# Patient Record
Sex: Female | Born: 1975 | Race: White | Hispanic: No | Marital: Married | Smoking: Never smoker
Health system: Southern US, Community
[De-identification: ages and names within clinical notes are randomized; demographics above are authoritative.]

## PROBLEM LIST (undated history)

## (undated) DIAGNOSIS — R51 Headache: Secondary | ICD-10-CM

## (undated) DIAGNOSIS — R519 Headache, unspecified: Secondary | ICD-10-CM

## (undated) DIAGNOSIS — O24419 Gestational diabetes mellitus in pregnancy, unspecified control: Secondary | ICD-10-CM

## (undated) DIAGNOSIS — E119 Type 2 diabetes mellitus without complications: Secondary | ICD-10-CM

## (undated) HISTORY — DX: Headache: R51

## (undated) HISTORY — DX: Headache, unspecified: R51.9

## (undated) HISTORY — PX: NO PAST SURGERIES: SHX2092

## (undated) HISTORY — DX: Type 2 diabetes mellitus without complications: E11.9

## (undated) HISTORY — DX: Gestational diabetes mellitus in pregnancy, unspecified control: O24.419

---

## 2015-09-06 NOTE — L&D Delivery Note (Signed)
Delivery Note At 4:47 PM a viable female was delivered via Vaginal, Spontaneous Delivery (presentation: vertex  APGAR: 8, 9; weight pending   Placenta status: to pathology as likely velamentous insertion, Cord:  with the following complications: none  Anesthesia:   Episiotomy: None Lacerations: 2nd degree;Perineal Suture Repair: 2.0 3.0 vicryl rapide Est. Blood Loss (mL):  200cc  Mom to postpartum.  Baby to Couplet care / Skin to Skin.  It's a girl, "Alison Roth!" AD2GM - for fasting BS in AM  Alison Roth 05/12/2016, 5:26 PM

## 2015-10-13 LAB — OB RESULTS CONSOLE HEPATITIS B SURFACE ANTIGEN: Hepatitis B Surface Ag: NEGATIVE

## 2015-10-13 LAB — OB RESULTS CONSOLE GC/CHLAMYDIA
Chlamydia: NEGATIVE
Gonorrhea: NEGATIVE

## 2015-10-13 LAB — OB RESULTS CONSOLE ANTIBODY SCREEN: Antibody Screen: NEGATIVE

## 2015-10-13 LAB — OB RESULTS CONSOLE RPR: RPR: NONREACTIVE

## 2015-10-13 LAB — OB RESULTS CONSOLE ABO/RH: RH TYPE: POSITIVE

## 2015-10-13 LAB — OB RESULTS CONSOLE HIV ANTIBODY (ROUTINE TESTING): HIV: NONREACTIVE

## 2015-10-13 LAB — OB RESULTS CONSOLE RUBELLA ANTIBODY, IGM: RUBELLA: IMMUNE

## 2016-03-14 ENCOUNTER — Encounter: Payer: Self-pay | Admitting: Skilled Nursing Facility1

## 2016-03-14 ENCOUNTER — Encounter: Payer: BC Managed Care – PPO | Attending: Obstetrics and Gynecology | Admitting: Skilled Nursing Facility1

## 2016-03-14 VITALS — Ht 67.0 in | Wt 239.0 lb

## 2016-03-14 DIAGNOSIS — Z713 Dietary counseling and surveillance: Secondary | ICD-10-CM | POA: Insufficient documentation

## 2016-03-14 DIAGNOSIS — O99810 Abnormal glucose complicating pregnancy: Secondary | ICD-10-CM | POA: Insufficient documentation

## 2016-03-14 DIAGNOSIS — O24410 Gestational diabetes mellitus in pregnancy, diet controlled: Secondary | ICD-10-CM

## 2016-03-14 NOTE — Progress Notes (Signed)
  Patient was seen on 03/14/2016 for Gestational Diabetes self-management class at the [facility]. The following learning objectives were met by the patient during this course:   States the definition of Gestational Diabetes  States why dietary management is important in controlling blood glucose  Describes the effects each nutrient has on blood glucose levels  Demonstrates ability to create a balanced meal plan  Demonstrates carbohydrate counting   States when to check blood glucose levels involving a total of 4 separate occurences in a day  Demonstrates proper blood glucose monitoring techniques  States the effect of stress and exercise on blood glucose levels  States the importance of limiting caffeine and abstaining from alcohol and smoking  Demonstrates the knowledge the glucometer provided in class may not be covered by their insurance and to call their insurance provider immediately after class to know which glucometer their insurance provider does cover as well as calling their physician the next day for a prescription to the glucometer their insurance does cover (if the one provided is not) as well as the lancets and strips for that meter.  Pt answered a phone call during class and was asked to step out while she talked on the cell phone  Blood glucose monitor given: pt states she already has one and is currently testing   Patient instructed to monitor glucose levels: FBS: 60 - <90 1 hour: <140 2 hour: <120  *Patient received handouts:  Nutrition Diabetes and Pregnancy  Carbohydrate Counting List  Patient will be seen for follow-up as needed.

## 2016-05-05 ENCOUNTER — Telehealth (HOSPITAL_COMMUNITY): Payer: Self-pay | Admitting: *Deleted

## 2016-05-05 ENCOUNTER — Encounter (HOSPITAL_COMMUNITY): Payer: Self-pay | Admitting: *Deleted

## 2016-05-05 LAB — OB RESULTS CONSOLE GBS: STREP GROUP B AG: POSITIVE

## 2016-05-05 NOTE — Telephone Encounter (Signed)
Preadmission screen  

## 2016-05-11 NOTE — H&P (Signed)
Alison MeagerKristy Roth is a 40 y.o. female presenting for IOL for h/o A2GDM. She has a favorable cervix (bishop's score 9). Her GDM is well c/w glyburide 2.5mg  qd. She is GBS positive and PCN allergic.   OB History    Gravida Para Term Preterm AB Living   2       1     SAB TAB Ectopic Multiple Live Births   1             Past Medical History:  Diagnosis Date  . Diabetes mellitus without complication (HCC)   . Gestational diabetes    glyburide  . Headache    Past Surgical History:  Procedure Laterality Date  . NO PAST SURGERIES     Family History: family history is not on file. She was adopted. Social History:  reports that she has never smoked. She has never used smokeless tobacco. Her alcohol and drug histories are not on file.     Maternal Diabetes: Yes:  Diabetes Type:  Insulin/Medication controlled Genetic Screening: Normal Maternal Ultrasounds/Referrals: Normal Fetal Ultrasounds or other Referrals:  None Maternal Substance Abuse:  No Significant Maternal Medications:  None except GBS pos Significant Maternal Lab Results:  None Other Comments:  None  ROS WNL except HPI History   Last menstrual period 08/13/2015. Exam Physical Exam  (from clinic) NAD, A&O NWOB Abd soft, nondistended, gravid SVE 3/70/-2, vertex  Prenatal labs: ABO, Rh: O/Positive/-- (02/07 0000) Antibody: Negative (02/07 0000) Rubella: Immune (02/07 0000) RPR: Nonreactive (02/07 0000)  HBsAg: Negative (02/07 0000)  HIV: Non-reactive (02/07 0000)  GBS: Positive (08/31 0000)   Assessment/Plan: A/P: Pt is a G2P0010 @ 39.0wga here for IOL for AGDM w/favorable cervix  IOL: A2GDM Bishop's score 9, SVE in office 3/70/-2 Plan for pitocin/AROM  MWB: h/o A2GDM c/w glyburide 2.5mg  qd Plan for intrapartum BS monitoring per protocol  FWB: vertex, last US 77%-ile Baby girl with normal panorama screen and US  GBS pos, clindamycin per protocol as PCN allergy  Belva AgeeElise Daphna Lafuente MD    [name]  Alison Roth 05/11/2016, 9:54 AM

## 2016-05-12 ENCOUNTER — Encounter (HOSPITAL_COMMUNITY): Payer: Self-pay

## 2016-05-12 ENCOUNTER — Inpatient Hospital Stay (HOSPITAL_COMMUNITY): Payer: BC Managed Care – PPO | Admitting: Anesthesiology

## 2016-05-12 ENCOUNTER — Inpatient Hospital Stay (HOSPITAL_COMMUNITY)
Admission: RE | Admit: 2016-05-12 | Discharge: 2016-05-14 | DRG: 775 | Disposition: A | Discharge status: Home / Self Care | Payer: BC Managed Care – PPO | Source: Ambulatory Visit | Attending: Obstetrics and Gynecology | Admitting: Obstetrics and Gynecology

## 2016-05-12 DIAGNOSIS — Z3A39 39 weeks gestation of pregnancy: Secondary | ICD-10-CM

## 2016-05-12 DIAGNOSIS — Z880 Allergy status to penicillin: Secondary | ICD-10-CM

## 2016-05-12 DIAGNOSIS — O24425 Gestational diabetes mellitus in childbirth, controlled by oral hypoglycemic drugs: Principal | ICD-10-CM | POA: Diagnosis present

## 2016-05-12 DIAGNOSIS — O24419 Gestational diabetes mellitus in pregnancy, unspecified control: Secondary | ICD-10-CM | POA: Diagnosis present

## 2016-05-12 DIAGNOSIS — O43123 Velamentous insertion of umbilical cord, third trimester: Secondary | ICD-10-CM | POA: Diagnosis present

## 2016-05-12 DIAGNOSIS — O99824 Streptococcus B carrier state complicating childbirth: Secondary | ICD-10-CM | POA: Diagnosis present

## 2016-05-12 LAB — RPR: RPR Ser Ql: NONREACTIVE

## 2016-05-12 LAB — CBC
HEMATOCRIT: 39.1 % (ref 36.0–46.0)
HEMOGLOBIN: 13.6 g/dL (ref 12.0–15.0)
MCH: 30.4 pg (ref 26.0–34.0)
MCHC: 34.8 g/dL (ref 30.0–36.0)
MCV: 87.5 fL (ref 78.0–100.0)
Platelets: 242 10*3/uL (ref 150–400)
RBC: 4.47 MIL/uL (ref 3.87–5.11)
RDW: 13.8 % (ref 11.5–15.5)
WBC: 8.7 10*3/uL (ref 4.0–10.5)

## 2016-05-12 LAB — ABO/RH: ABO/RH(D): O POS

## 2016-05-12 LAB — GLUCOSE, CAPILLARY
Glucose-Capillary: 70 mg/dL (ref 65–99)
Glucose-Capillary: 78 mg/dL (ref 65–99)

## 2016-05-12 LAB — TYPE AND SCREEN
ABO/RH(D): O POS
ANTIBODY SCREEN: NEGATIVE

## 2016-05-12 LAB — GLUCOSE, RANDOM: GLUCOSE: 98 mg/dL (ref 65–99)

## 2016-05-12 MED ORDER — OXYCODONE-ACETAMINOPHEN 5-325 MG PO TABS: 2.0000 | ORAL_TABLET | ORAL | Status: DC | PRN

## 2016-05-12 MED ORDER — TETANUS-DIPHTH-ACELL PERTUSSIS 5-2.5-18.5 LF-MCG/0.5 IM SUSP: 0.5000 mL | Freq: Once | INTRAMUSCULAR | Status: DC

## 2016-05-12 MED ORDER — IBUPROFEN 600 MG PO TABS
600.0000 mg | ORAL_TABLET | Freq: Four times a day (QID) | ORAL | Status: DC
  Administered 2016-05-12 – 2016-05-14 (×6): 600 mg via ORAL
  Filled 2016-05-12 (×6): qty 1

## 2016-05-12 MED ORDER — ONDANSETRON HCL 4 MG/2ML IJ SOLN
4.0000 mg | Freq: Four times a day (QID) | INTRAMUSCULAR | Status: DC | PRN
Start: 2016-05-12 — End: 2016-05-12

## 2016-05-12 MED ORDER — ACETAMINOPHEN 325 MG PO TABS: 650.0000 mg | ORAL_TABLET | ORAL | Status: DC | PRN

## 2016-05-12 MED ORDER — OXYCODONE-ACETAMINOPHEN 5-325 MG PO TABS: 1.0000 | ORAL_TABLET | ORAL | Status: DC | PRN

## 2016-05-12 MED ORDER — LIDOCAINE HCL (PF) 1 % IJ SOLN
30.0000 mL | INTRAMUSCULAR | Status: DC | PRN
Start: 2016-05-12 — End: 2016-05-12
  Filled 2016-05-12: qty 30

## 2016-05-12 MED ORDER — PRENATAL MULTIVITAMIN CH
1.0000 | ORAL_TABLET | Freq: Every day | ORAL | Status: DC
  Administered 2016-05-13: 1 via ORAL
  Filled 2016-05-12: qty 1

## 2016-05-12 MED ORDER — TERBUTALINE SULFATE 1 MG/ML IJ SOLN
0.2500 mg | Freq: Once | INTRAMUSCULAR | Status: DC | PRN
  Filled 2016-05-12: qty 1

## 2016-05-12 MED ORDER — DIPHENHYDRAMINE HCL 25 MG PO CAPS: 25.0000 mg | ORAL_CAPSULE | Freq: Four times a day (QID) | ORAL | Status: DC | PRN

## 2016-05-12 MED ORDER — PHENYLEPHRINE 40 MCG/ML (10ML) SYRINGE FOR IV PUSH (FOR BLOOD PRESSURE SUPPORT)
80.0000 ug | PREFILLED_SYRINGE | INTRAVENOUS | Status: DC | PRN
  Filled 2016-05-12: qty 5

## 2016-05-12 MED ORDER — BENZOCAINE-MENTHOL 20-0.5 % EX AERO
1.0000 "application " | INHALATION_SPRAY | CUTANEOUS | Status: DC | PRN
  Administered 2016-05-13: 1 via TOPICAL
  Filled 2016-05-12 (×2): qty 56

## 2016-05-12 MED ORDER — OXYTOCIN 40 UNITS IN LACTATED RINGERS INFUSION - SIMPLE MED
1.0000 m[IU]/min | INTRAVENOUS | Status: DC
  Administered 2016-05-12: 2 m[IU]/min via INTRAVENOUS
  Filled 2016-05-12: qty 1000

## 2016-05-12 MED ORDER — CLINDAMYCIN PHOSPHATE 900 MG/50ML IV SOLN
900.0000 mg | Freq: Three times a day (TID) | INTRAVENOUS | Status: DC
  Administered 2016-05-12: 900 mg via INTRAVENOUS
  Filled 2016-05-12 (×3): qty 50

## 2016-05-12 MED ORDER — ONDANSETRON HCL 4 MG PO TABS: 4.0000 mg | ORAL_TABLET | ORAL | Status: DC | PRN

## 2016-05-12 MED ORDER — LACTATED RINGERS IV SOLN
500.0000 mL | Freq: Once | INTRAVENOUS | Status: DC
Start: 2016-05-12 — End: 2016-05-12

## 2016-05-12 MED ORDER — FENTANYL 2.5 MCG/ML BUPIVACAINE 1/10 % EPIDURAL INFUSION (WH - ANES)
14.0000 mL/h | INTRAMUSCULAR | Status: DC | PRN
  Administered 2016-05-12: 14 mL/h via EPIDURAL
  Filled 2016-05-12: qty 125

## 2016-05-12 MED ORDER — EPHEDRINE 5 MG/ML INJ
10.0000 mg | INTRAVENOUS | Status: DC | PRN
  Filled 2016-05-12: qty 4

## 2016-05-12 MED ORDER — PHENYLEPHRINE 40 MCG/ML (10ML) SYRINGE FOR IV PUSH (FOR BLOOD PRESSURE SUPPORT)
80.0000 ug | PREFILLED_SYRINGE | INTRAVENOUS | Status: DC | PRN
  Filled 2016-05-12: qty 5
  Filled 2016-05-12: qty 10

## 2016-05-12 MED ORDER — DIPHENHYDRAMINE HCL 50 MG/ML IJ SOLN: 12.5000 mg | INTRAMUSCULAR | Status: DC | PRN

## 2016-05-12 MED ORDER — SENNOSIDES-DOCUSATE SODIUM 8.6-50 MG PO TABS
2.0000 | ORAL_TABLET | ORAL | Status: DC
  Administered 2016-05-13: 2 via ORAL
  Filled 2016-05-12: qty 2

## 2016-05-12 MED ORDER — COCONUT OIL OIL
1.0000 "application " | TOPICAL_OIL | Status: DC | PRN
  Filled 2016-05-12: qty 120

## 2016-05-12 MED ORDER — DEXTROSE IN LACTATED RINGERS 5 % IV SOLN: INTRAVENOUS | Status: DC

## 2016-05-12 MED ORDER — LACTATED RINGERS IV SOLN: 500.0000 mL | INTRAVENOUS | Status: DC | PRN

## 2016-05-12 MED ORDER — OXYTOCIN BOLUS FROM INFUSION: 500.0000 mL | Freq: Once | INTRAVENOUS | Status: DC

## 2016-05-12 MED ORDER — LACTATED RINGERS IV SOLN
INTRAVENOUS | Status: DC
  Administered 2016-05-12 (×2): via INTRAVENOUS

## 2016-05-12 MED ORDER — ZOLPIDEM TARTRATE 5 MG PO TABS: 5.0000 mg | ORAL_TABLET | Freq: Every evening | ORAL | Status: DC | PRN

## 2016-05-12 MED ORDER — SIMETHICONE 80 MG PO CHEW: 80.0000 mg | CHEWABLE_TABLET | ORAL | Status: DC | PRN

## 2016-05-12 MED ORDER — DIBUCAINE 1 % RE OINT: 1.0000 "application " | TOPICAL_OINTMENT | RECTAL | Status: DC | PRN

## 2016-05-12 MED ORDER — ONDANSETRON HCL 4 MG/2ML IJ SOLN: 4.0000 mg | INTRAMUSCULAR | Status: DC | PRN

## 2016-05-12 MED ORDER — OXYTOCIN 40 UNITS IN LACTATED RINGERS INFUSION - SIMPLE MED: 2.5000 [IU]/h | INTRAVENOUS | Status: DC

## 2016-05-12 MED ORDER — FLEET ENEMA 7-19 GM/118ML RE ENEM: 1.0000 | ENEMA | RECTAL | Status: DC | PRN

## 2016-05-12 MED ORDER — LIDOCAINE HCL (PF) 1 % IJ SOLN
INTRAMUSCULAR | Status: DC | PRN
  Administered 2016-05-12 (×2): 7 mL via EPIDURAL

## 2016-05-12 MED ORDER — SOD CITRATE-CITRIC ACID 500-334 MG/5ML PO SOLN: 30.0000 mL | ORAL | Status: DC | PRN

## 2016-05-12 MED ORDER — WITCH HAZEL-GLYCERIN EX PADS: 1.0000 "application " | MEDICATED_PAD | CUTANEOUS | Status: DC | PRN

## 2016-05-12 NOTE — Anesthesia Pain Management Evaluation Note (Signed)
  CRNA Pain Management Visit Note  Patient: Alison Roth, 40 y.o., female  "Hello I am a member of the anesthesia team at [facility]. We have an anesthesia team available at all times to provide care throughout the hospital, including epidural management and anesthesia for C-section. I don't know your plan for the delivery whether it a natural birth, water birth, IV sedation, nitrous supplementation, doula or epidural, but we want to meet your pain goals."   1.Was your pain managed to your expectations on prior hospitalizations?   No prior hospitalizations  2.What is your expectation for pain management during this hospitalization?     Labor support without medications  3.How can we help you reach that goal? unsure  Record the patient's initial score and the patient's pain goal.   Pain: 0  Pain Goal: 8 [facility] wants you to be able to say your pain was always managed very well.  Cephus ShellingBURGER,Alison Roth 05/12/2016

## 2016-05-12 NOTE — Progress Notes (Signed)
S: No complaints.   O:  Vitals  Vitals:   05/12/16 0750 05/12/16 0820  BP: 109/72 112/73  Pulse: (!) 102 89  Resp: 18   Temp: 98.2 F (36.8 C)     Gen: NAD SVE: 3/2/-2, vertex FHT: cat 1 Toco: irritable  A/P: A/P: Pt is a G2P0010 @ 39.0wga here for IOL for AGDM w/favorable cervix  IOL: A2GDM SVE 3/2/-2, vertex S/p AROM @ 0830 for clear fluid Cont pitocin  MWB: h/o A2GDM c/w glyburide 2.5mg  qd Plan for intrapartum BS monitoring per protocol  FWB: vertex, last US 77%-ile EFW 7#6 by my leopolds Baby girl with normal panorama screen and US  GBS pos, clindamycin per protocol as PCN allergy  Belva AgeeElise Trelon Plush MD

## 2016-05-12 NOTE — Lactation Note (Addendum)
This note was copied from a baby's chart. Lactation Consultation Note  Patient Name: Alison Roth ZOXWR'UToday's Date: 05/12/2016 Reason for consult: Initial assessment   Initial consult with first time mom of 1 hour old infant. Infant is STS with mom and cueing to feed. Infant is fussy and difficult to console. Mom GDM on Glyburide per history.  Mom with large compressible breasts and everted nipples. Infant is noted to have a high palate. Infant latched to both breasts intermittently and was on and off. She was noted to have a few swallows. BF Basics reviewed with parents. Enc mom to massage breast with feedings. Briefly showed mom how to hand express. Infant was crying a lot while I was in the room making teaching difficult.  BF Resources Handout and LC Brochure given, mom informed of IP/OP Services, BF Support Groups and LC phone #. Enc parents to call out to desk for assistance as needed. Infant left STS with mom at end of feeding.    Maternal Data Formula Feeding for Exclusion: No Has patient been taught Hand Expression?: Yes Does the patient have breastfeeding experience prior to this delivery?: No  Feeding Feeding Type: Breast Fed Length of feed: 15 min  LATCH Score/Interventions Latch: Repeated attempts needed to sustain latch, nipple held in mouth throughout feeding, stimulation needed to elicit sucking reflex. Intervention(s): Adjust position;Assist with latch;Breast massage;Breast compression  Audible Swallowing: A few with stimulation Intervention(s): Alternate breast massage;Hand expression;Skin to skin  Type of Nipple: Everted at rest and after stimulation  Comfort (Breast/Nipple): Soft / non-tender     Hold (Positioning): Assistance needed to correctly position infant at breast and maintain latch. Intervention(s): Breastfeeding basics reviewed;Support Pillows;Position options;Skin to skin  LATCH Score: 7  Lactation Tools Discussed/Used WIC Program:  No   Consult Status Consult Status: Follow-up Date: 05/13/16 Follow-up type: In-patient    Silas FloodSharon S Nimisha Rathel 05/12/2016, 6:34 PM

## 2016-05-12 NOTE — Anesthesia Preprocedure Evaluation (Signed)
Anesthesia Evaluation  Patient identified by MRN, date of birth, ID band Patient awake    Reviewed: Allergy & Precautions, H&P , NPO status , Patient's Chart, lab work & pertinent test results  Airway Mallampati: II  TM Distance: >3 FB Neck ROM: full    Dental no notable dental hx.    Pulmonary neg pulmonary ROS,    Pulmonary exam normal        Cardiovascular negative cardio ROS Normal cardiovascular exam     Neuro/Psych negative psych ROS   GI/Hepatic negative GI ROS, Neg liver ROS,   Endo/Other  negative endocrine ROSdiabetes, Gestational  Renal/GU negative Renal ROS     Musculoskeletal   Abdominal (+) + obese,   Peds  Hematology negative hematology ROS (+)   Anesthesia Other Findings   Reproductive/Obstetrics (+) Pregnancy                             Anesthesia Physical Anesthesia Plan  ASA: II  Anesthesia Plan: Epidural   Post-op Pain Management:    Induction:   Airway Management Planned:   Additional Equipment:   Intra-op Plan:   Post-operative Plan:   Informed Consent: I have reviewed the patients History and Physical, chart, labs and discussed the procedure including the risks, benefits and alternatives for the proposed anesthesia with the patient or authorized representative who has indicated his/her understanding and acceptance.     Plan Discussed with:   Anesthesia Plan Comments:         Anesthesia Quick Evaluation

## 2016-05-12 NOTE — Anesthesia Procedure Notes (Addendum)
Epidural Patient location during procedure: OB Start time: 05/12/2016 4:19 PM End time: 05/12/2016 4:23 PM  Staffing Anesthesiologist: Leilani AbleHATCHETT, Alexei Ey Performed: anesthesiologist   Preanesthetic Checklist Completed: patient identified, surgical consent, pre-op evaluation, timeout performed, IV checked, risks and benefits discussed and monitors and equipment checked  Epidural Patient position: sitting Prep: site prepped and draped and DuraPrep Patient monitoring: continuous pulse ox and blood pressure Approach: midline Location: L3-L4 Injection technique: LOR air  Needle:  Needle type: Tuohy  Needle gauge: 17 G Needle length: 9 cm and 9 Needle insertion depth: 6 cm Catheter type: closed end flexible Catheter size: 19 Gauge Catheter at skin depth: 11 cm Test dose: negative and Other  Assessment Sensory level: T9 Events: blood not aspirated, injection not painful, no injection resistance, negative IV test and no paresthesia  Additional Notes Reason for block:procedure for pain

## 2016-05-12 NOTE — Progress Notes (Signed)
S: No complaints. Starting to feel ctxn intermittently.   O:  Vitals:   05/12/16 1130 05/12/16 1229  BP: 119/68 127/62  Pulse: 79 81  Resp:    Temp:  98.2 F (36.8 C)    Gen: NAD SVE:4/80/-2, vertex FHT: cat 1 Toco: q 6-10 min  A/P: A/P: Pt is a G2P0010 @ 39.0wga here for IOL for AGDM w/favorable cervix.    IOL: A2GDM S/p AROM @ 0830 for clear fluid Cont pitocin titration for ctxn q 2-3 min, currently at 16  MWB: h/o A2GDM c/w glyburide 2.5mg  qd Cont BS monitoring per protocol - BS in 70-90s, reassuring  FWB: vertex, last US 77%-ile EFW 7#6 by my leopolds Baby girl with normal panorama screen and US  GBS pos: cont clindamycin per protocol as PCN allergic  Belva AgeeElise Nakoa Ganus MD

## 2016-05-13 ENCOUNTER — Inpatient Hospital Stay (HOSPITAL_COMMUNITY): Admission: AD | Admit: 2016-05-13 | Payer: Self-pay | Source: Ambulatory Visit | Admitting: Obstetrics and Gynecology

## 2016-05-13 LAB — CBC
HEMATOCRIT: 33.9 % — AB (ref 36.0–46.0)
HEMOGLOBIN: 11.7 g/dL — AB (ref 12.0–15.0)
MCH: 29.8 pg (ref 26.0–34.0)
MCHC: 34.5 g/dL (ref 30.0–36.0)
MCV: 86.3 fL (ref 78.0–100.0)
Platelets: 229 10*3/uL (ref 150–400)
RBC: 3.93 MIL/uL (ref 3.87–5.11)
RDW: 13.7 % (ref 11.5–15.5)
WBC: 11.7 10*3/uL — AB (ref 4.0–10.5)

## 2016-05-13 LAB — GLUCOSE, RANDOM: Glucose, Bld: 79 mg/dL (ref 65–99)

## 2016-05-13 NOTE — Anesthesia Postprocedure Evaluation (Signed)
Anesthesia Post Note  Patient: Alison MeagerKristy Roth  Procedure(s) Performed: * No procedures listed *  Patient location during evaluation: Mother Baby Anesthesia Type: Epidural Level of consciousness: awake and alert, oriented and patient cooperative Pain management: pain level controlled Vital Signs Assessment: post-procedure vital signs reviewed and stable Respiratory status: spontaneous breathing Cardiovascular status: stable Postop Assessment: no headache, epidural receding, patient able to bend at knees and no signs of nausea or vomiting Anesthetic complications: no Comments: Denies pain.     Last Vitals:  Vitals:   05/13/16 0030 05/13/16 0652  BP: (!) 112/52 134/69  Pulse: (!) 57 84  Resp: 18 17  Temp: 37.2 C 36.8 C    Last Pain:  Vitals:   05/13/16 0652  TempSrc: Oral  PainSc:    Pain Goal:                 [facility] CenterWRINKLE,Jatavia Keltner

## 2016-05-13 NOTE — Progress Notes (Signed)
Post Partum Day 1 Subjective: no complaints, up ad lib, voiding and tolerating PO Baby under phototherapy Objective: Blood pressure 134/69, pulse 84, temperature 98.2 F (36.8 C), temperature source Oral, resp. rate 17, height 5\' 7"  (1.702 m), weight 243 lb (110.2 kg), last menstrual period 08/13/2015, unknown if currently breastfeeding.  Physical Exam:  General: alert and cooperative Lochia: appropriate Uterine Fundus: firm Incision: healing well DVT Evaluation: No evidence of DVT seen on physical exam. Negative Homan's sign. No cords or calf tenderness. No significant calf/ankle edema.   Recent Labs  05/12/16 0803 05/13/16 0645  HGB 13.6 11.7*  HCT 39.1 33.9*    Assessment/Plan: Plan for discharge tomorrow and Breastfeeding   LOS: 1 day   Alison Roth G 05/13/2016, 8:03 AM

## 2016-05-13 NOTE — Lactation Note (Signed)
This note was copied from a baby's chart. Lactation Consultation Note  Patient Name: Girl Milderd MeagerKristy Faulcon AVWUJ'WToday's Date: 05/13/2016 Reason for consult: Follow-up assessment;NICU baby;Other (Comment) (hyperbilirubinemia)   with this mom of a now 20 hours old baby that was sent to NICU. Mom haas easily expressed colostrum, and mom return demonstrated hand expression with good techniqe. Dad also volunteered to learn, to help mom. I set up dep and had mom pump in initiation setting, and advised her to switch to maintenance when her milk transitions in. Mom will probably go home tomorrow,, so maintenance setting will begin then. Mom has a personal  DEP, but she may decide to rent a [employer] pump for 2 weeks. Mom was told by Dr. Eric FormWimmer that she can breastfeed the baby in the NICU. Mom to pump after visiting the baby, at least 8 times a day, or more. Mom expressed at least 2 ml's with first pumping and hand expression. Mom will call me to see her and figure out how to use her personal DEP.    Maternal Data    Feeding    LATCH Score/Interventions                      Lactation Tools Discussed/Used WIC Program: No Pump Review: Setup, frequency, and cleaning;Milk Storage;Other (comment) (review of NICU book, hand expression, pump setting) Initiated by:: Danton Clapchristine Destane Speas, RN IBCLC Date initiated:: 05/13/16   Consult Status Consult Status: Follow-up Date: 05/14/16 Follow-up type: In-patient    Alfred LevinsLee, Isabel Freese Anne 05/13/2016, 1:45 PM

## 2016-05-14 MED ORDER — IBUPROFEN 600 MG PO TABS: 600.0000 mg | ORAL_TABLET | Freq: Four times a day (QID) | ORAL | 0 refills | Status: AC | PRN

## 2016-05-14 NOTE — Progress Notes (Signed)
Post Partum Day 2 Subjective: no complaints, up ad lib, voiding, tolerating PO and + flatus  Objective: Blood pressure 115/69, pulse 78, temperature 98.2 F (36.8 C), temperature source Oral, resp. rate 18, height 5\' 7"  (1.702 m), weight 243 lb (110.2 kg), last menstrual period 08/13/2015, unknown if currently breastfeeding.  Physical Exam:  General: alert, cooperative and no distress Lochia: appropriate Uterine Fundus: firm Incision: healing well DVT Evaluation: No evidence of DVT seen on physical exam.   Recent Labs  05/12/16 0803 05/13/16 0645  HGB 13.6 11.7*  HCT 39.1 33.9*    Assessment/Plan: Discharge home   LOS: 2 days   Alison Roth,Alison Roth 05/14/2016, 9:56 AM

## 2016-05-14 NOTE — Discharge Instructions (Signed)
No vaginal entry No operation automobiles at least 1 week

## 2016-05-14 NOTE — Discharge Summary (Signed)
Obstetric Discharge Summary Reason for Admission: induction of labor Prenatal Procedures: none Intrapartum Procedures: spontaneous vaginal delivery Postpartum Procedures: none Complications-Operative and Postpartum: none Hemoglobin  Date Value Ref Range Status  05/13/2016 11.7 (L) 12.0 - 15.0 g/dL Final   HCT  Date Value Ref Range Status  05/13/2016 33.9 (L) 36.0 - 46.0 % Final    Physical Exam:  General: alert, cooperative and no distress Lochia: appropriate Uterine Fundus: firm Incision: healing well DVT Evaluation: No evidence of DVT seen on physical exam.  Discharge Diagnoses: Term Pregnancy-delivered  Discharge Information: Date: 05/14/2016 Activity: pelvic rest Diet: routine Medications: PNV and Ibuprofen Condition: stable Instructions: refer to practice specific booklet Discharge to: home   Newborn Data: Live born female  Birth Weight: 7 lb 0.2 oz (3180 g) APGAR: 8, 9  Home with NICU.  Kamrynn Melott II,Undray Allman E 05/14/2016, 9:58 AM

## 2016-05-14 NOTE — Progress Notes (Signed)
Discharge instructions provided to patient at bedside.,  Acivity, medications, when to call the doctor, follow up appointments and community resources discussed.  No questions at this time.  Patient left unit in stable condition with all personal belongings and rented breast pump accompanied by staff.  Infant in NICU.  Osvaldo AngstK. Jalyn Dutta, RN--------

## 2016-05-14 NOTE — Lactation Note (Signed)
This note was copied from a baby's chart. Lactation Consultation Note  Mother hand expressed 3 ml before pumping. Observed pumping.  Provided mother w/ 2 week rental. Plan is: . Breastfeed baby on demand on both breasts when at hospital. . Hand express before latching and pumping. . Plan is to continue to pump 7-8 times a day for 10-15 min. and give baby back what is pumped at next feeding while she is in hospital.   . OP  9/15 Call to confirm. [phone] . Try out your insurance pump. . Review breast milk storage guidelines in Baby and Me Booklet. . Once baby is home breastfeed on demand.  Continue to pump 4-5 times a day until baby is breastfeeding vigorously at the breast and not having to wake for feedings.  Give volume back to baby at next feeding.  Reviewed engorgement care and monitoring voids/stools.  Patient Name: Alison Milderd MeagerKristy Pence [id number]'BToday's Date: 05/14/2016     Maternal Data    Feeding    LATCH Score/Interventions                      Lactation Tools Discussed/Used     Consult Status      Dahlia ByesBerkelhammer, Ruth Boschen 05/14/2016, 11:08 AM

## 2016-05-20 ENCOUNTER — Ambulatory Visit (HOSPITAL_COMMUNITY)
Admission: RE | Admit: 2016-05-20 | Discharge: 2016-05-20 | Disposition: A | Discharge status: Home / Self Care | Payer: BC Managed Care – PPO | Source: Ambulatory Visit | Attending: Obstetrics and Gynecology | Admitting: Obstetrics and Gynecology

## 2016-05-20 NOTE — Lactation Note (Signed)
Lactation Consult  Mother's reason for visit:  Per mom concerns with latching. , getting proper fit with NS, concerns with length of time she feeding.  Visit Type:  Feeding assessment  Appointment Notes: Baby was in NICU for jaundice , Transitioning back to breast. Pt. Confirmed appt. 9/15  Consult:  Initial Lactation Consultant:  Kathrin Greathouse  ________________________________________________________________________ Baby's Name:  Alison Roth Date of Birth:  05/12/2014 Pediatrician: Dr. Charlene Brooke - [facility] [place]  Gender:  female Gestational Age: [redacted]w[redacted]d (At Birth) Birth Weight:  7 lb 0.2 oz (3180 g) Weight at Discharge:  Weight: 6 lb 8.8 oz (2970 g)               Date of Discharge:  05/15/2016      [facility] Weights   05/14/16 0240 05/14/16 1600 05/15/16 1850  Weight: 6 lb 12.3 oz (3070 g) 6 lb 11.6 oz (3050 g) 6 lb 8.8 oz (2970 g)  Last weight taken from location outside of [facility]:  6-7 oz   Location:Pediatrician's office Weight today:  3026 g , 6-10.7 oz     ________________________________________________________________________  Mother's Name: Alison Roth Type of delivery:  per mom inducted due to DM /  Breastfeeding Experience: 1st baby  Maternal Medical Conditions:  Gestational diabetes mellitis. ( per mom had multiply breast changes with pregnancy  And denies engorgement challenges since milk came in at 4 days PP )  Maternal Medications:  PNV   ________________________________________________________________________  Breastfeeding History (Post Discharge)  Frequency of breastfeeding:  Every 3-4 hours / on demand  Duration of feeding: ( evenings dad feeds bottle of EBM x1 ) - 20- 30 mins  2 days ago started using the #24 NS at home with some success ( mom had received for a gift )  Per mom latch has been comfortable.   Supplementing: with Avent slow flow nipple ( formula at 1st, and for the last 2 days EBM )  45 -60 ml   Pumping:  DEBP ( rented 2 weeks [employer])  Every 3-4 hours with 2-3 oz EBM yield   Infant Intake and Output Assessment  Voids:  10 -12  in 24 hrs.  Color:  Clear yellow Stools:  8-9  in 24 hrs.  Color:  Brown and Yellow  ________________________________________________________________________  Maternal Breast Assessment  Breast:  Full Nipple:  Erect , with edema at the base of the nipple/ compressible areola  Pain level:  0 Pain interventions:  Expressed breast milk  _______________________________________________________________________ Feeding Assessment/Evaluation  Initial feeding assessment:  Infant's oral assessment:  Variance - high palate / mobility of tongue   Positioning:  Football Right breast     LATCH documentation: @ 1st tried to latch without the NS and baby unable to sustain depth and stay latched.   Latch:  1 = Repeated attempts needed to sustain latch, nipple held in mouth throughout feeding, stimulation needed to elicit sucking reflex.  Audible swallowing:  2 = Spontaneous and intermittent  Type of nipple:  2 = Everted at rest and after stimulation  Comfort (Breast/Nipple):  1 = Filling, red/small blisters or bruises, mild/mod discomfort  Hold (Positioning):  1 = Assistance needed to correctly position infant at breast and maintain latch  LATCH score:  7   Attached assessment:  Shallow @ 1st , with LC assist with positioning improved depth.   Lips flanged:  No./ LC flipped upper lip to flanged position   Lips untucked:  No./ LC eased chin down to  widen latch to accommodate the #24 NS   Suck assessment:  Nutritive and Nonnutritive  Tools:  Nipple shield 24 mm Instructed on use and cleaning of tool:  Yes.    Pre-feed weight:  3026 g , 6-10.7 oz  Post-feed weight:  3044 g , 6-11.4 oz  Amount transferred: 18 ml  Amount supplemented: none at this latch   Additional Feeding Assessment -   Infant's oral assessment:  High palate   Positioning:  Football Left  breast  LATCH documentation:   Latch:  2 = Grasps breast easily, tongue down, lips flanged, rhythmical sucking.  Audible swallowing:  2 = Spontaneous and intermittent  Type of nipple:  2 = Everted at rest and after stimulation  Comfort (Breast/Nipple):  1 = Filling, red/small blisters or bruises, mild/mod discomfort  Hold (Positioning):  1 = Assistance needed to correctly position infant at breast and maintain latch  LATCH score:  8   Attached assessment:  Shallow / @ 1st and with positioning and flipping upper lip and ease chin helped   Lips flanged:  No. / yes with LC assist   Lips untucked:  No./ LC eased chin down   Suck assessment:  Nutritive and Nonnutritive  Tools:  Nipple shield 20 mm ( started out with #20 NS , and changed due  to the baby being so  Non - nutritive) to #24 NS and the baby accommodated well , with assist for depth.  Instructed on use and cleaning of tool:  Yes.     Stool diaper changed by LC - re-weight   Pre-feed weight:  6-10.4 oz , 3016 g  Post-feed weight:  6-10.8 oz , 3026 g  Amount transferred: 10 ml  Amount supplemented: none   Total amount pumped post feed: didn't post pump .  LC just looked at moms new pump she brought in - [employer] - " Arpo" - flanges appear like #24 Standard   Total amount transferred: 42 ml ( for 8 day old off the breast is adequate  Total supplement given:  None    Lactation Impression: Baby was in NICU and got use to a bottle for feeding  Mom recently started using the NS for latching - so baby is now back to the breast  Mom needed review for proper fitting of the NS. ( after few tries was able to fit well )  Baby is also noted to be be non - nutritive at the breast / needed stimulation .  ( for consult had last fed 9 am 15 mins breast and 45 ml of EBM( full meal for 8 day old)  @ this consult - latching only transferred 3442 ml - for 528 days old adequate  Voids and stools QS  Mom is motivated to make this work. And is  aware to feed as needed .  [facility] baby needs work to transition back to the breast with NS  And since the had early introduction to a bottle nipple - continue 1-2 times a day ( see LC plan below )     Lactation Plan of care:  Praised mom for her efforts breast feeding and pumping Goal- increase weight up to birth weight by 14 days of life  Work on latching at the breast and shifting the baby to be more nutritive at the breast instead of non - nutritive.  For no - use the #24 NS until the baby is gaining well due to being use to a  bottle and high palate  Call back in 2 weeks for F/U LC assessment to work on weaning her off .  [facility] discussed with mom weaning baby off the NS - the how !)  Option #1 - Breast feed both breast, always softening the 1st breast well ) then offer the 2nd  Option #2 - breast feed 1st breast 15 -20 mins . Supplement for 2nd breast with a bottle and post pump 10 -15 mins  1 feeding at night - have dad feed a bottle - mom pumps 15 -20 mins . Save milk ( per mom already doing it and plan to continue for sleep )  LC stressed the importance of protecting milk supply and pumping at least once during the night.  Feedings every 2 1/2 - 3 hours days / evenings and nights at least at 3 hours.  Important - use the #24 NS consistently for next 2 weeks - call for F/U in about 1 1/2 weeks for appt. - to work on weaning her off.  When using a bottle - tickle upper lip until Gardiner Ramus opens wide and then latch - check for flanged lips.

## 2019-05-23 ENCOUNTER — Encounter (HOSPITAL_COMMUNITY): Payer: Self-pay

## 2019-05-23 ENCOUNTER — Other Ambulatory Visit: Payer: Self-pay | Admitting: Student

## 2019-05-23 ENCOUNTER — Inpatient Hospital Stay (HOSPITAL_COMMUNITY): Payer: BC Managed Care – PPO

## 2019-05-23 ENCOUNTER — Inpatient Hospital Stay (HOSPITAL_COMMUNITY)
Admission: AD | Admit: 2019-05-23 | Discharge: 2019-05-23 | Disposition: A | Discharge status: Home / Self Care | Payer: BC Managed Care – PPO | Attending: Obstetrics & Gynecology | Admitting: Obstetrics & Gynecology

## 2019-05-23 ENCOUNTER — Other Ambulatory Visit: Payer: Self-pay

## 2019-05-23 DIAGNOSIS — O039 Complete or unspecified spontaneous abortion without complication: Secondary | ICD-10-CM | POA: Diagnosis not present

## 2019-05-23 DIAGNOSIS — Z881 Allergy status to other antibiotic agents status: Secondary | ICD-10-CM | POA: Insufficient documentation

## 2019-05-23 DIAGNOSIS — Z880 Allergy status to penicillin: Secondary | ICD-10-CM | POA: Insufficient documentation

## 2019-05-23 DIAGNOSIS — O021 Missed abortion: Secondary | ICD-10-CM | POA: Diagnosis not present

## 2019-05-23 LAB — SAMPLE TO BLOOD BANK

## 2019-05-23 LAB — CBC
HCT: 35.6 % — ABNORMAL LOW (ref 36.0–46.0)
Hemoglobin: 11.6 g/dL — ABNORMAL LOW (ref 12.0–15.0)
MCH: 27.6 pg (ref 26.0–34.0)
MCHC: 32.6 g/dL (ref 30.0–36.0)
MCV: 84.6 fL (ref 80.0–100.0)
Platelets: 310 10*3/uL (ref 150–400)
RBC: 4.21 MIL/uL (ref 3.87–5.11)
RDW: 14.5 % (ref 11.5–15.5)
WBC: 16.7 10*3/uL — ABNORMAL HIGH (ref 4.0–10.5)
nRBC: 0 % (ref 0.0–0.2)

## 2019-05-23 NOTE — Discharge Instructions (Signed)
Miscarriage °A miscarriage is the loss of an unborn baby (fetus) before the 20th week of pregnancy. °Follow these instructions at home: °Medicines ° °· Take over-the-counter and prescription medicines only as told by your doctor. °· If you were prescribed antibiotic medicine, take it as told by your doctor. Do not stop taking the antibiotic even if you start to feel better. °· Do not take NSAIDs unless your doctor says that this is safe for you. NSAIDs include aspirin and ibuprofen. These medicines can cause bleeding. °Activity °· Rest as directed. Ask your doctor what activities are safe for you. °· Have someone help you at home during this time. °General instructions °· Write down how many pads you use each day and how soaked they are. °· Watch the amount of tissue or clumps of blood (blood clots) that you pass from your vagina. Save any large amounts of tissue for your doctor. °· Do not use tampons, douche, or have sex until your doctor approves. °· To help you and your partner with the process of grieving, talk with your doctor or seek counseling. °· When you are ready, meet with your doctor to talk about steps you should take for your health. Also, talk with your doctor about steps to take to have a healthy pregnancy in the future. °· Keep all follow-up visits as told by your doctor. This is important. °Contact a doctor if: °· You have a fever or chills. °· You have vaginal discharge that smells bad. °· You have more bleeding. °Get help right away if: °· You have very bad cramps or pain in your back or belly. °· You pass clumps of blood that are walnut-sized or larger from your vagina. °· You pass tissue that is walnut-sized or larger from your vagina. °· You soak more than 1 regular pad in an hour. °· You get light-headed or weak. °· You faint (pass out). °· You have feelings of sadness that do not go away, or you have thoughts of hurting yourself. °Summary °· A miscarriage is the loss of an unborn baby before  the 20th week of pregnancy. °· Follow your doctor's instructions for home care. Keep all follow-up appointments. °· To help you and your partner with the process of grieving, talk with your doctor or seek counseling. °This information is not intended to replace advice given to you by your health care provider. Make sure you discuss any questions you have with your health care provider. °Document Released: 11/14/2011 Document Revised: 12/14/2018 Document Reviewed: 09/27/2016 °Elsevier Patient Education © 2020 Elsevier Inc. ° °

## 2019-05-23 NOTE — MAU Note (Addendum)
Pt recently dx with SAB. D&C sched today @ 1400 with Dr. Corinna Capra. Presents with inc in [place] since 2100 yesterday. Bldg started as a moderate flow, with small clots during 2100-0330; changing 4 large pads. Bldg inc to large flow, with a clot the size of a "baseball" from 0330-now.  Lower, abd cramping 4/10.   Gilmer Mor RN

## 2019-05-23 NOTE — MAU Provider Note (Addendum)
Chief Complaint: Vaginal Bleeding   First Provider Initiated Contact with Patient 05/23/19 [phone]        SUBJECTIVE HPI: Alison Roth is a 43 y.o. G2P1011 at Unknown by LMP who presents to maternity admissions reporting heavy bleeding and cramping all night since 2100hrs.  Has a known missed abortion and was scheduled to have an in-office D&C today.  .Now has some dizziness. She denies vaginal itching/burning, urinary symptoms, h/a, n/v, or fever/chills.    RN Note: Pt recently dx with SAB. D&C sched today @ 1400 with Dr. Corinna Capra. Presents with inc in [place] since 2100 yesterday. Bldg started as a moderate flow, with small clots during 2100-0330; changing 4 large pads. Bldg inc to large flow, with a clot the size of a "baseball" from 0330-now.  Lower, abd cramping 4/10.    Past Medical History:  Diagnosis Date  . Diabetes mellitus without complication ([place])   . Gestational diabetes    glyburide  . Headache    Past Surgical History:  Procedure Laterality Date  . NO PAST SURGERIES     Social History   Socioeconomic History  . Marital status: Married    Spouse name: Not on file  . Number of children: Not on file  . Years of education: Not on file  . Highest education level: Not on file  Occupational History  . Not on file  Social Needs  . Financial resource strain: Not on file  . Food insecurity    Worry: Not on file    Inability: Not on file  . Transportation needs    Medical: Not on file    Non-medical: Not on file  Tobacco Use  . Smoking status: Never Smoker  . Smokeless tobacco: Never Used  Substance and Sexual Activity  . Alcohol use: Not Currently  . Drug use: Not Currently  . Sexual activity: Not on file  Lifestyle  . Physical activity    Days per week: Not on file    Minutes per session: Not on file  . Stress: Not on file  Relationships  . Social Herbalist on phone: Not on file    Gets together: Not on file    Attends religious service: Not on  file    Active member of club or organization: Not on file    Attends meetings of clubs or organizations: Not on file    Relationship status: Not on file  . Intimate partner violence    Fear of current or ex partner: Not on file    Emotionally abused: Not on file    Physically abused: Not on file    Forced sexual activity: Not on file  Other Topics Concern  . Not on file  Social History Narrative  . Not on file   No current facility-administered medications on file prior to encounter.    Current Outpatient Medications on File Prior to Encounter  Medication Sig Dispense Refill  . ibuprofen (ADVIL,MOTRIN) 600 MG tablet Take 1 tablet (600 mg total) by mouth every 6 (six) hours as needed. 30 tablet 0  . Prenatal Vit-Fe Fumarate-FA (PRENATAL MULTIVITAMIN) TABS tablet Take 1 tablet by mouth daily at 12 noon.     Allergies  Allergen Reactions  . Penicillins     Has patient had a PCN reaction causing immediate rash, facial/tongue/throat swelling, SOB or lightheadedness with hypotension: Yes Has patient had a PCN reaction causing severe rash involving mucus membranes or skin necrosis: Yes Has patient had a  PCN reaction that required hospitalization No Has patient had a PCN reaction occurring within the last 10 years: No If all of the above answers are "NO", then may proceed with Cephalosporin use.   Marland Kitchen. Amoxicillin Hives and Rash  . Erythromycin Hives and Rash  . Minocin [Minocycline] Hives and Rash  . Tetracyclines & Related Hives and Rash    I have reviewed patient's Past Medical Hx, Surgical Hx, Family Hx, Social Hx, medications and allergies.   ROS:  Review of Systems  Constitutional: Negative for chills and fever.  Respiratory: Negative for shortness of breath.   Gastrointestinal: Negative for constipation, diarrhea and nausea.  Genitourinary: Positive for pelvic pain and vaginal bleeding.  Neurological: Positive for dizziness.    Other systems negative   Physical Exam   Physical Exam Patient Vitals for the past 24 hrs:  BP Temp Temp src Pulse Resp SpO2 Height Weight  05/23/19 0557 100/74 - - 94 - - - -  05/23/19 0550 (!) 111/59 98.4 F (36.9 C) Oral 91 16 100 % - -  05/23/19 0535 - - - - - - 5\' 7"  (1.702 m) 112.1 kg   Constitutional: Well-developed, well-nourished female in no acute distress.  Cardiovascular: normal rate Respiratory: normal effort GI: Abd soft, non-tender. Pos BS x 4 MS: Extremities nontender, no edema, normal ROM Neurologic: Alert and oriented x 4.  GU: Neg CVAT.  PELVIC EXAM: Moderate amount of clots and tissue visible at cervical os                           Attempted removal by Ring forceps, unsuccessful                           Bimanual exam done, able to grasp entire fetus which had delivered through cervix into vagina                           Fetus placed in specimen cup, parents declined to see it.  Sent to pathology  LAB RESULTS Results for orders placed or performed during the hospital encounter of 05/23/19 (from the past 24 hour(s))  CBC     Status: Abnormal   Collection Time: 05/23/19  7:04 AM  Result Value Ref Range   WBC 16.7 (H) 4.0 - 10.5 K/uL   RBC 4.21 3.87 - 5.11 MIL/uL   Hemoglobin 11.6 (L) 12.0 - 15.0 g/dL   HCT 78.235.6 (L) 95.636.0 - 21.346.0 %   MCV 84.6 80.0 - 100.0 fL   MCH 27.6 26.0 - 34.0 pg   MCHC 32.6 30.0 - 36.0 g/dL   RDW 08.614.5 57.811.5 - 46.915.5 %   Platelets 310 150 - 400 K/uL   nRBC 0.0 0.0 - 0.2 %  Sample to Blood Bank     Status: None   Collection Time: 05/23/19  7:04 AM  Result Value Ref Range   Blood Bank Specimen SAMPLE AVAILABLE FOR TESTING    Sample Expiration      05/24/2019,2359 Performed at [facility], [address] [place], [place] [id number]    IMAGING Kore[place] Ob Comp Less 14 Wks  Result Date: 05/23/2019 CLINICAL DATA:  Vaginal bleeding EXAM: OBSTETRIC <14 WK ULTRASOUND TECHNIQUE: Transabdominal ultrasound was performed for evaluation of the gestation as well as the maternal uterus  and adnexal regions. COMPARISON:  None. FINDINGS: Intrauterine gestational sac: Not visualized Yolk  sac:  Not visualized not visualized Embryo: Not visualized Cardiac Activity: Not visualized Subchorionic hemorrhage:  None visualized. Maternal uterus/adnexae: The endometrium is thickened and somewhat hyperechoic. Cervical os is closed. Right ovary measures 2.1 x 1.8 x 1.5 cm. Left ovary measures 5.0 x 5.1 x 4.8 cm. There is a cyst/corpus luteum arising from the left measuring 4.6 x 4.0 x 3.8 cm. No free pelvic fluid evident. IMPRESSION: No intrauterine gestation evident. Endometrium is thickened and hypervascular. Assuming positive pregnancy test, differential considerations must include intrauterine gestation too early to be seen by either transabdominal transvaginal technique; recent spontaneous abortion; possible ectopic gestation. Close clinical and laboratory surveillance in this regard advised. Timing of repeat ultrasound in large part will depend on beta HCG values going forward. Electronically Signed   By: Bretta Bang III M.D.   On: 05/23/2019 08:02   MAU Management/MDM: Ordered US to evaluate for any retained products Ordered CBC since patient had a lot of blood loss tonight and is feeling dizzy. Sample to blood bank in case we need to transfuse Consulted Dr Elon Spanner so she can relay information to office due to patient being scheduled for D&C.  Care turned over to K [place] CNM  ASSESSMENT   PLAN  Wynelle Bourgeois CNM, MSN Certified Nurse-Midwife 05/23/2019  6:24 AM  -Patient still having some bleeding but reports it is lessening. She desires discharge.  -CBC stable at 11.6.  -Spoke with radiology, who revised report to state that No POC currently seen but that they cannot be excluded.  -Dr. Elon Spanner at the bedside to discuss options with patient (MVA in MAU, cytotec, keep appt with Dr. Rana Snare this afternoon). Dr. Elon Spanner assumes care of the patient.

## 2019-05-23 NOTE — MAU Note (Signed)
Pt informs RN that she knows that nightshift was sending POC to pathology but that her and her SO do not want this done. They decline any kind of testing on POC unless specifically ordered by Dr Corinna Capra. This RN informs Ardean Larsen CNM of pt wishes and states she will take care of it.

## 2019-05-23 NOTE — Consult Note (Signed)
Alison Roth is a  43yo G2P1011 presenting to MAU with heavy bleeding and known MAB. Was dx with MAB int he office by her primary doctor, Dr. Corinna Capra and was scheduled for [facility] in office today.  She passed a ten week size fetus prior to my arrival. An [place] was performed which showed thickened EMS to 3.3cm and hypervascularity that cannot exclude rPOC. She continues to have bleeding but it is MUCH lighter. She hasn't changed her pad in >1 hour. She currently denies dizziness and SOB.   O: AFVSS Gen: well appearing, NAD CV: Reg rate Pulm: NWOB Abd: soft, nondistended, nontender, no masses GYN: Cervix closed. Scant blood on pad Ext: No edema b/l  PMH: see MAU provider note   CBC    Component Value Date/Time   WBC 16.7 (H) 05/23/2019 0704   RBC 4.21 05/23/2019 0704   HGB 11.6 (L) 05/23/2019 0704   HCT 35.6 (L) 05/23/2019 0704   PLT 310 05/23/2019 0704   MCV 84.6 05/23/2019 0704   MCH 27.6 05/23/2019 0704   MCHC 32.6 05/23/2019 0704   RDW 14.5 05/23/2019 0704    [place]:  No intrauterine gestation evident. Endometrium is thickened and hypervascular. Assuming positive pregnancy test, differential considerations must include intrauterine gestation too early to be seen by either transabdominal transvaginal technique; recent spontaneous abortion; possible ectopic gestation. Close clinical and laboratory surveillance in this regard advised. Timing of repeat ultrasound in large part will depend on beta HCG values going Forward. ADDENDUM: By report, patient had recent spontaneous abortion. There is a degree of hypervascularity within the endometrium. The endometrium is mildly inhomogeneous. While no discrete products of conception are evident, the possibility of retrained products of conception contributing to the inhomogeneity and hypervascularity is a Possibility.  A/P: 43 yo G2P1011 (SVD by me in 2017) presenting with spontaneous miscarriage.Bleeding slowed and no s/s of anemia on exam.   However, [place] cannot exclude rPOC. Options d/w pt in detail including observation with repeat US in 1 week, cytotec and f/u with [place] in office, D&C here at bedside, D&C in the oR under anesthesia, and keeping appt with Dr. Corinna Capra today for in office [facility] under anesthesia. Risks discussed in detail of each option. She strongly desires D&C with Dr. Corinna Capra in the office. Dr. Corinna Capra was called and informed and he will perform her procedure later today.    Arty Baumgartner MD

## 2019-05-24 LAB — SURGICAL PATHOLOGY

## 2021-02-26 IMAGING — US US OB COMP LESS 14 WK
1 series · 14 of 27 positions shown · non-contrast
Comparison: None.
COMPARISON: None.

Addendum:
CLINICAL DATA: Vaginal bleeding

EXAM:
OBSTETRIC <14 WK ULTRASOUND
TECHNIQUE: Transabdominal ultrasound was performed for evaluation of the
gestation as well as the maternal uterus and adnexal regions.

[Series 1: us ob comp less 14 wk · 27 acquisitions, 14 frames shown]
[im 1/27]
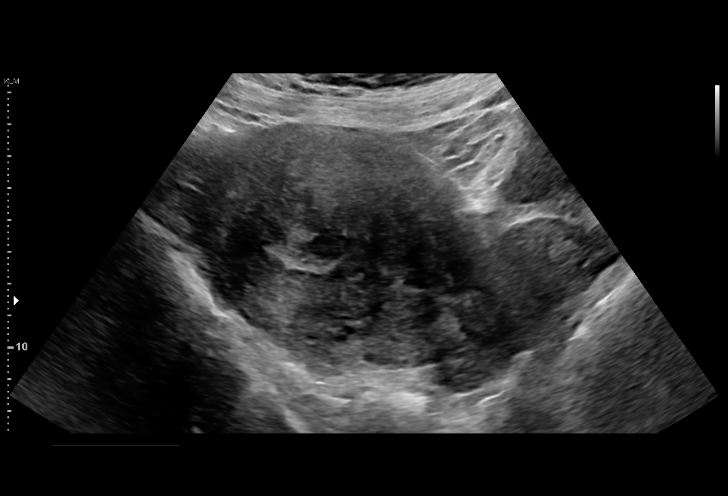
[im 3/27]
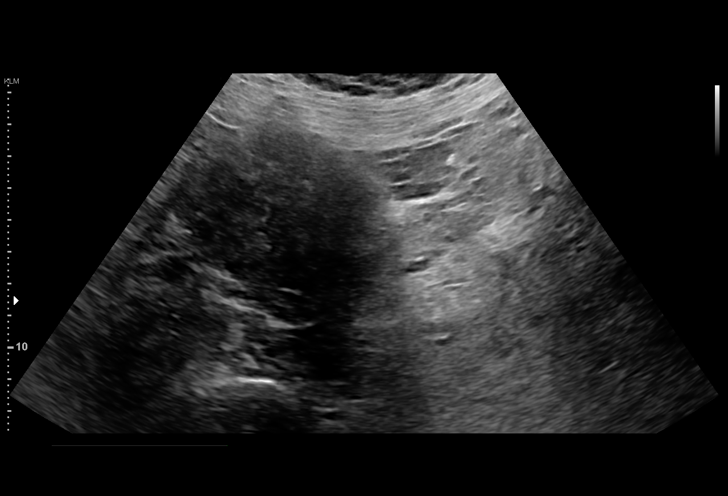
[im 5/27]
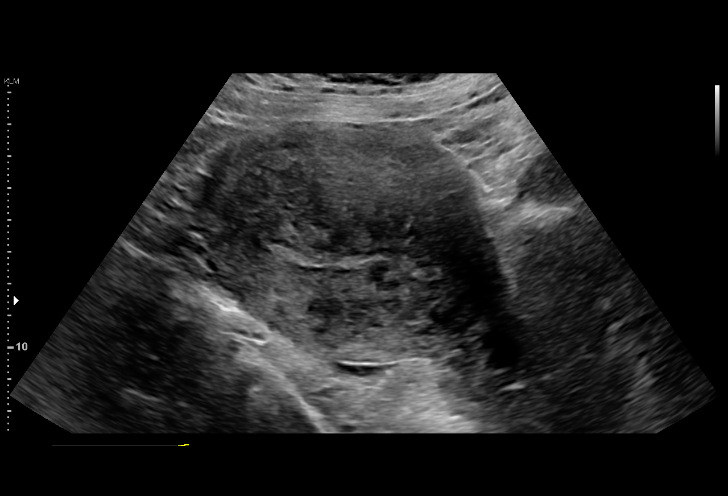
[im 7/27]
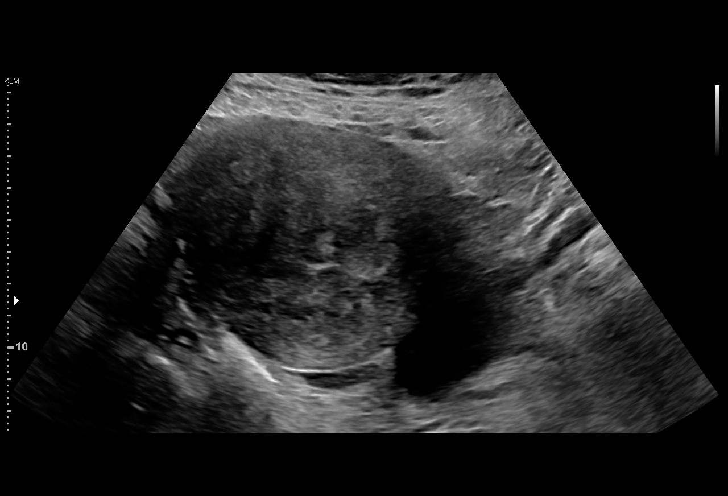
[im 9/27]
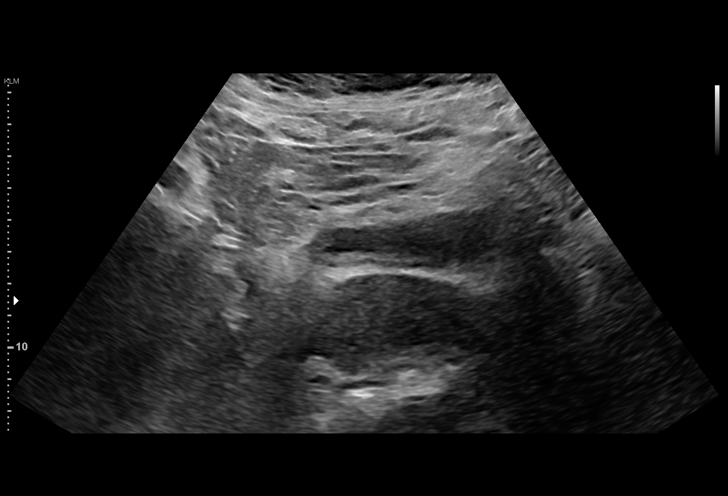
[im 11/27]
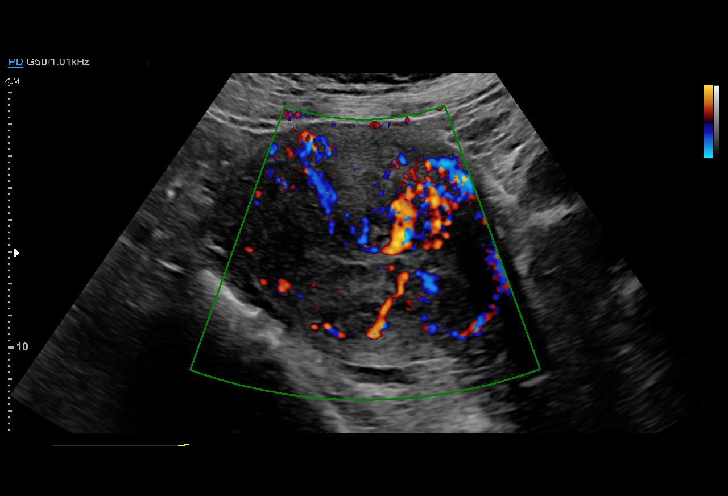
[im 13/27]
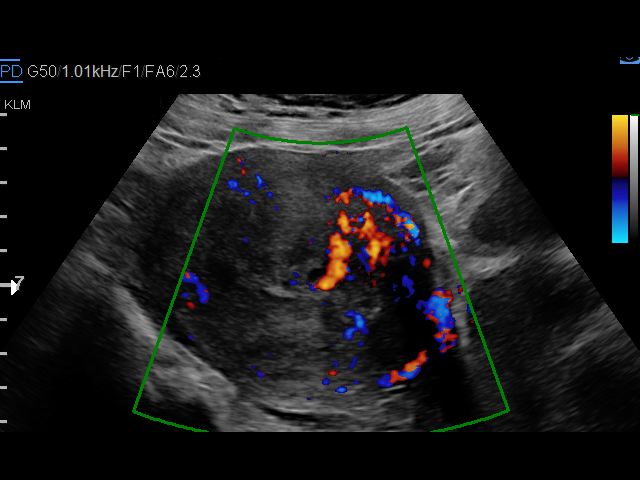
[im 15/27]
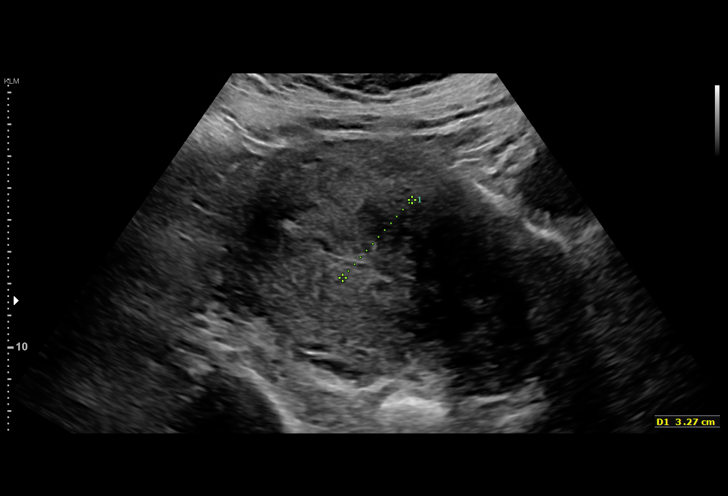
[im 17/27]
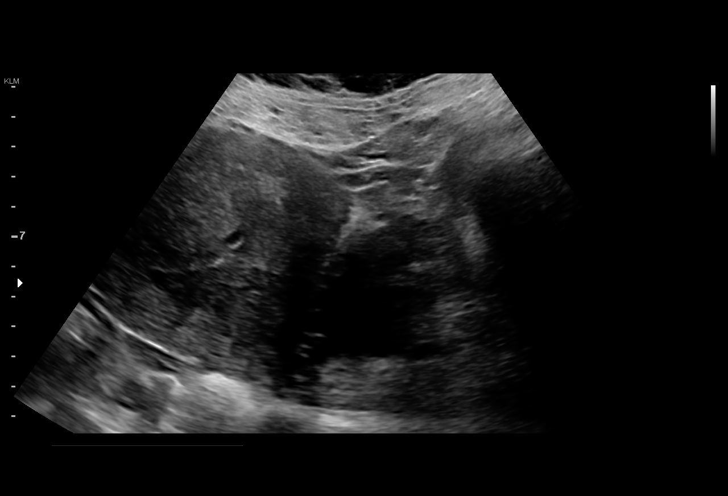
[im 19/27]
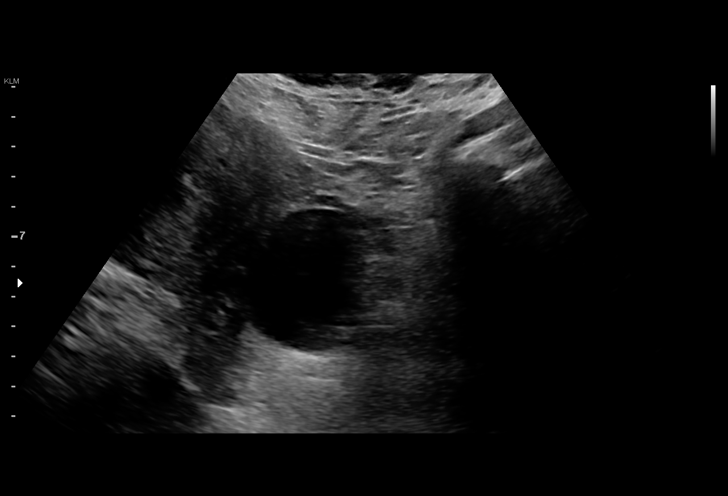
[im 21/27]
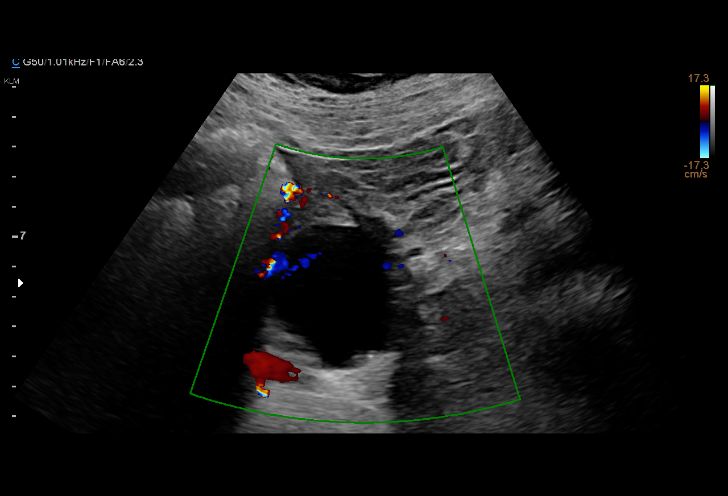
[im 23/27]
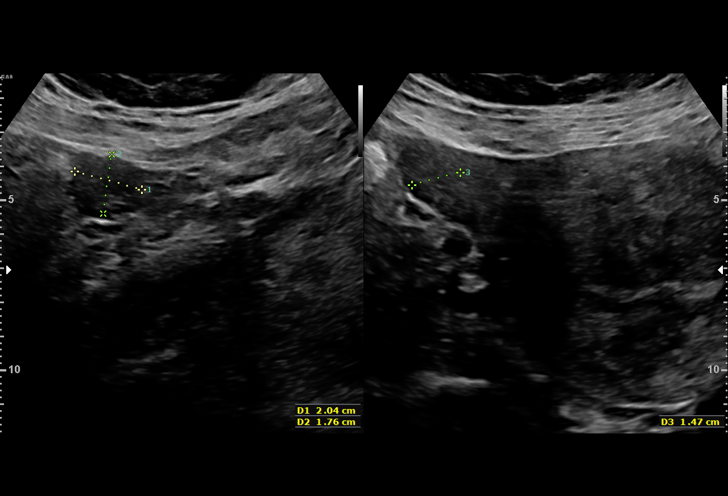
[im 25/27]
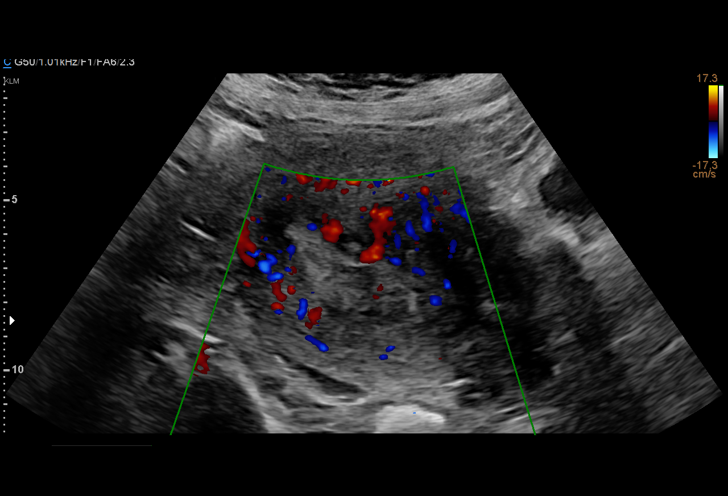
[im 27/27]
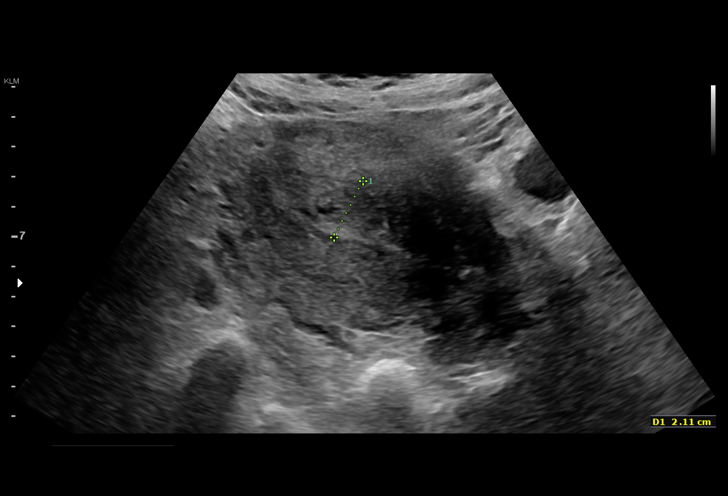

[14 of 27 positions shown; findings below may reference images not displayed]

FINDINGS: Intrauterine gestational sac: Not visualized

Yolk sac:  Not visualized not visualized

Embryo: Not visualized

Cardiac Activity: Not visualized

Subchorionic hemorrhage:  None visualized.

Maternal uterus/adnexae: The endometrium is thickened and somewhat
hyperechoic. Cervical os is closed.

Right ovary measures 2.1 x 1.8 x 1.5 cm. Left ovary measures 5.0 x
5.1 x 4.8 cm. There is a cyst/corpus luteum arising from the left
measuring 4.6 x 4.0 x 3.8 cm. No free pelvic fluid evident.
IMPRESSION: No intrauterine gestation evident. Endometrium is thickened and
hypervascular. Assuming positive pregnancy test, differential
considerations must include intrauterine gestation too early to be
seen by either transabdominal transvaginal technique; recent
spontaneous abortion; possible ectopic gestation. Close clinical and
laboratory surveillance in this regard advised. Timing of repeat
ultrasound in large part will depend on beta HCG values going
forward.

ADDENDUM:
By report, patient had recent spontaneous abortion. There is a
degree of hypervascularity within the endometrium. The endometrium
is mildly inhomogeneous. While no discrete products of conception
are evident, the possibility of retrained products of conception
contributing to the inhomogeneity and hypervascularity is a
possibility.

*** End of Addendum ***
FINDINGS: Intrauterine gestational sac: Not visualized

Yolk sac:  Not visualized not visualized

Embryo: Not visualized

Cardiac Activity: Not visualized

Subchorionic hemorrhage:  None visualized.

Maternal uterus/adnexae: The endometrium is thickened and somewhat
hyperechoic. Cervical os is closed.

Right ovary measures 2.1 x 1.8 x 1.5 cm. Left ovary measures 5.0 x
5.1 x 4.8 cm. There is a cyst/corpus luteum arising from the left
measuring 4.6 x 4.0 x 3.8 cm. No free pelvic fluid evident.
IMPRESSION: No intrauterine gestation evident. Endometrium is thickened and
hypervascular. Assuming positive pregnancy test, differential
considerations must include intrauterine gestation too early to be
seen by either transabdominal transvaginal technique; recent
spontaneous abortion; possible ectopic gestation. Close clinical and
laboratory surveillance in this regard advised. Timing of repeat
ultrasound in large part will depend on beta HCG values going
forward.

## 2022-03-02 ENCOUNTER — Other Ambulatory Visit: Payer: Self-pay | Admitting: Obstetrics and Gynecology

## 2022-03-02 ENCOUNTER — Ambulatory Visit
Admission: RE | Admit: 2022-03-02 | Discharge: 2022-03-02 | Disposition: A | Discharge status: Home / Self Care | Payer: BC Managed Care – PPO | Source: Ambulatory Visit | Attending: Obstetrics and Gynecology | Admitting: Obstetrics and Gynecology

## 2022-03-02 DIAGNOSIS — Z139 Encounter for screening, unspecified: Secondary | ICD-10-CM
